# Patient Record
Sex: Male | Born: 2000 | Race: Black or African American | Hispanic: No | Marital: Single
Health system: Southern US, Community
[De-identification: ages and names within clinical notes are randomized; demographics above are authoritative.]

---
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
## 2020-11-22 IMAGING — US US ART/VEN ABD/PELV/SCROTUM DOPPLER LTD
1 series · 14 of 25 positions shown · non-contrast
Comparison: None.

CLINICAL DATA: 18-year-old male with right testicular pain x2
weeks.

EXAM:
SCROTAL ULTRASOUND
DOPPLER ULTRASOUND OF THE TESTICLES
TECHNIQUE: Complete ultrasound examination of the testicles, epididymis, and
other scrotal structures was performed. Color and spectral Doppler
ultrasound were also utilized to evaluate blood flow to the
testicles.

[Series 1: us art/ven abd/pelv/scrotum doppler ltd · 14 of 91 slices shown]
[im 1/91]
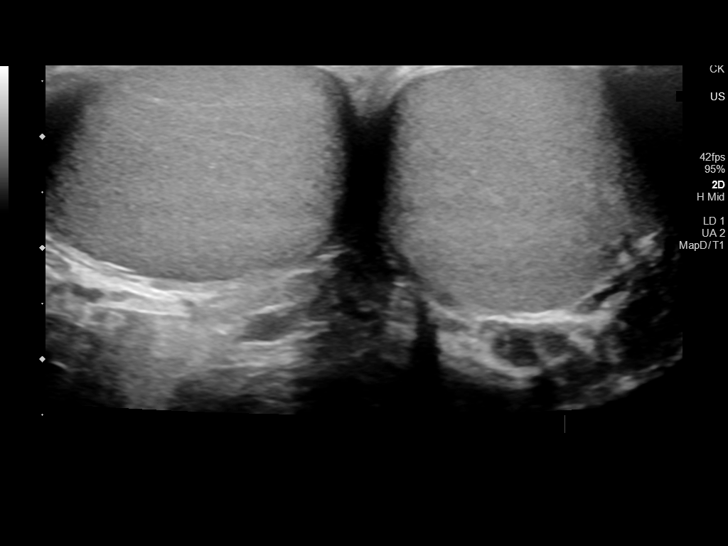
[im 8/91]
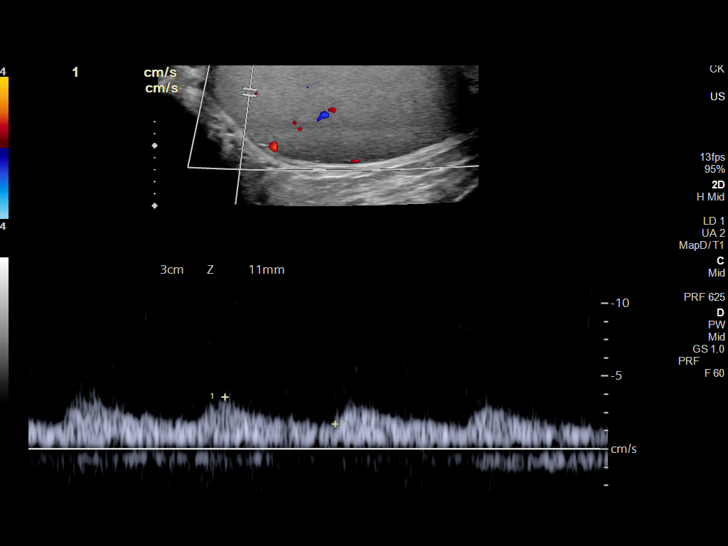
[im 16/91]
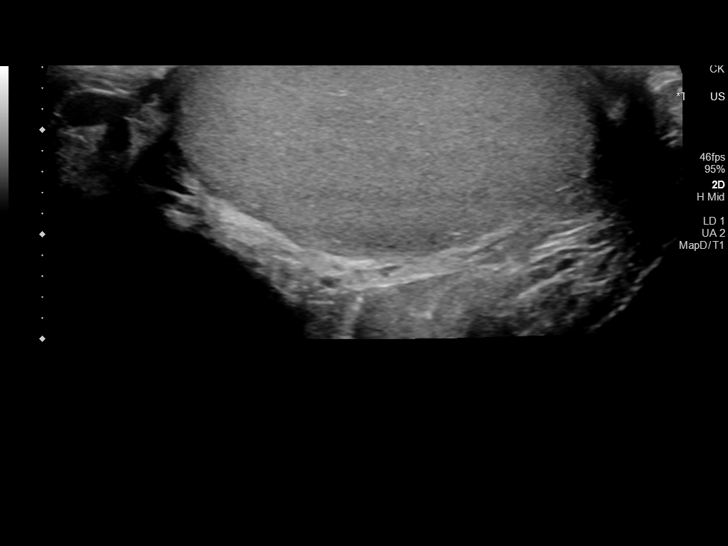
[im 23/91]
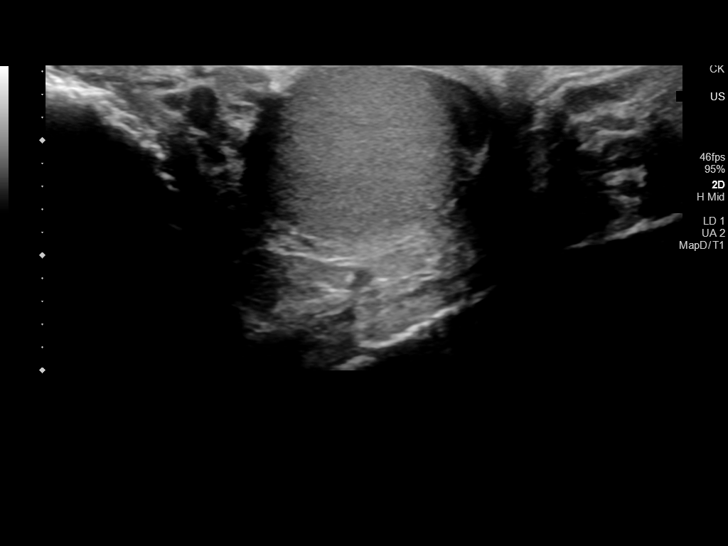
[im 31/91]
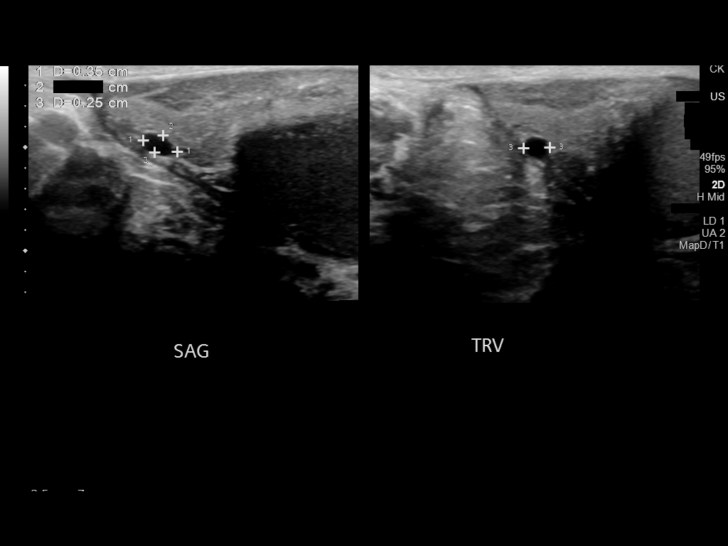
[im 34/91]
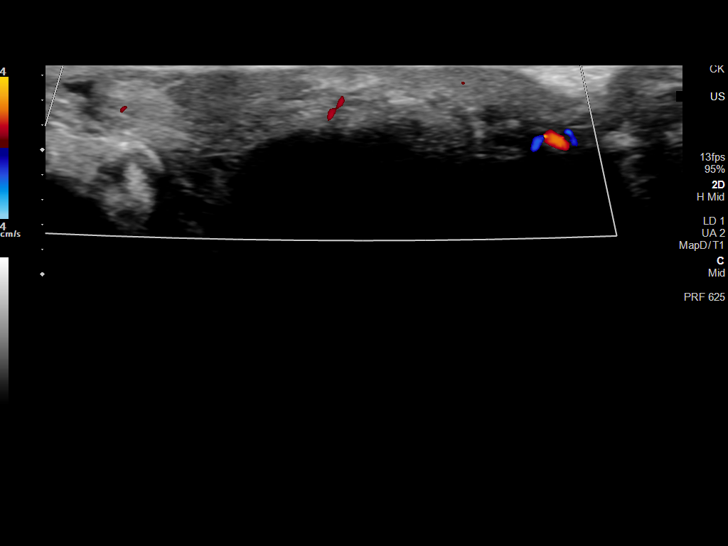
[im 42/91]
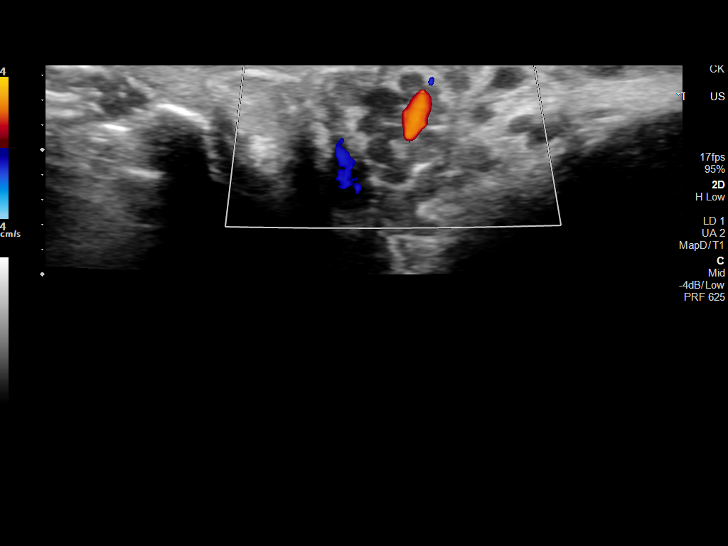
[im 49/91]
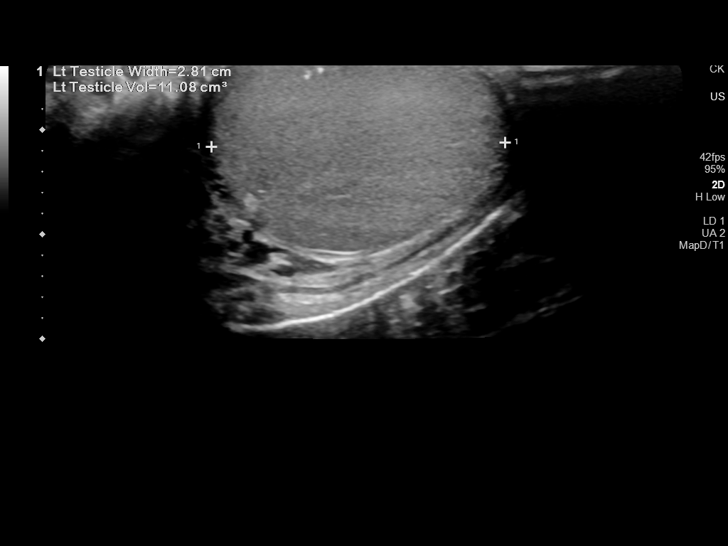
[im 57/91]
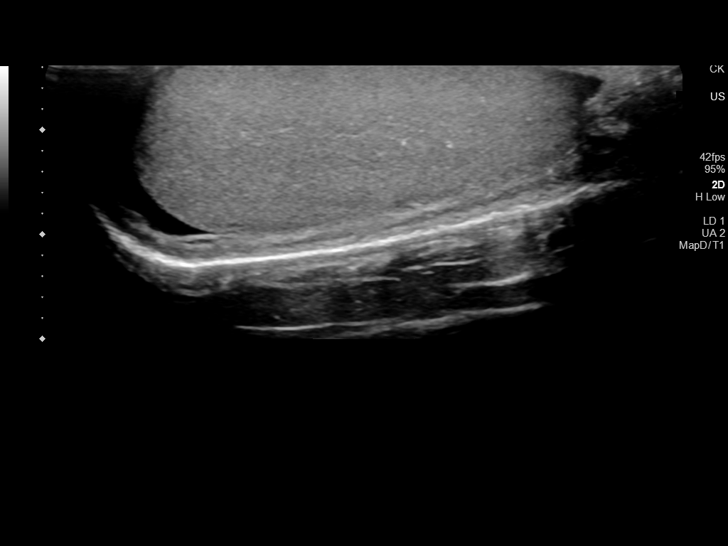
[im 61/91]
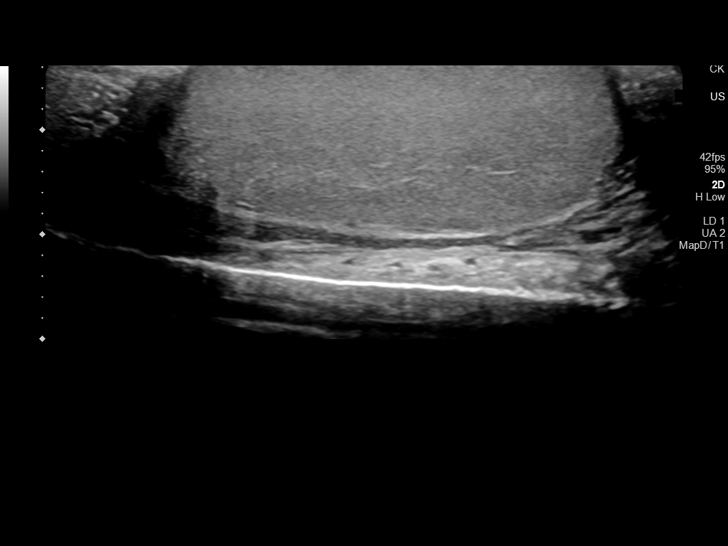
[im 68/91]
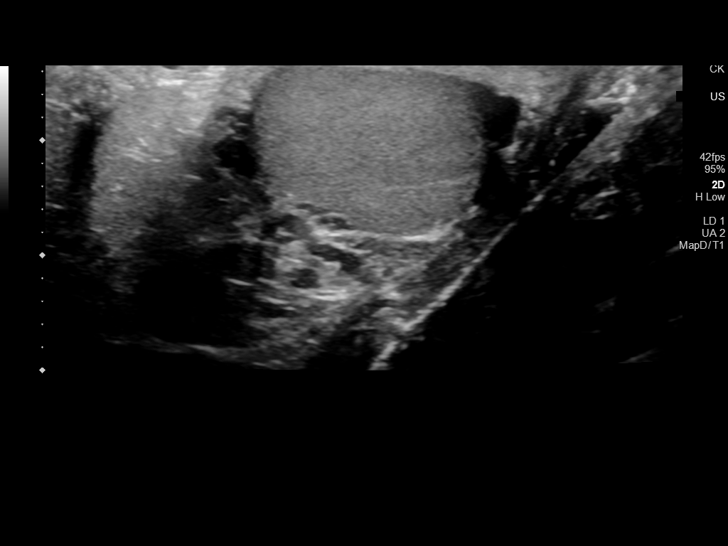
[im 76/91]
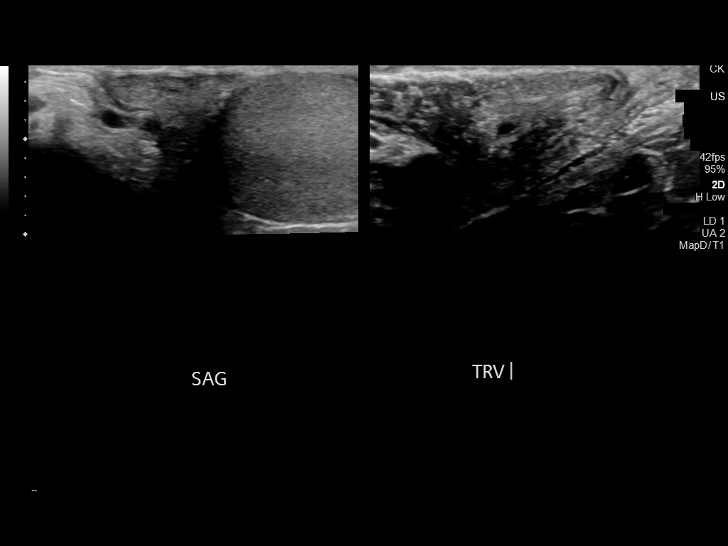
[im 83/91]
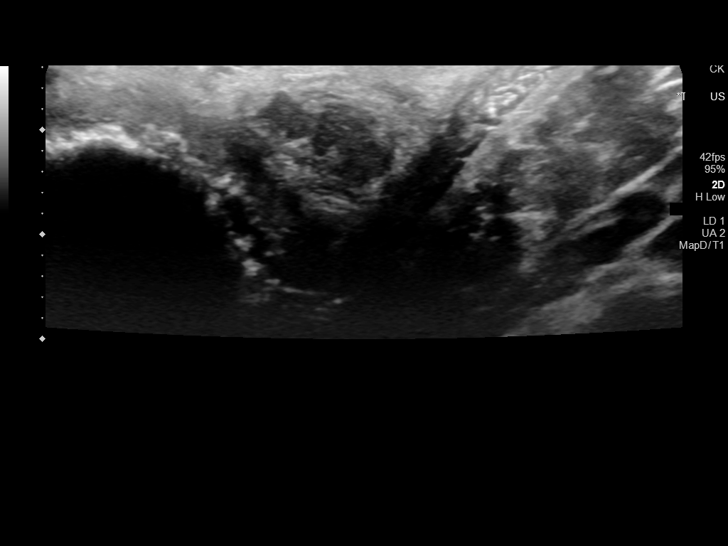
[im 91/91]
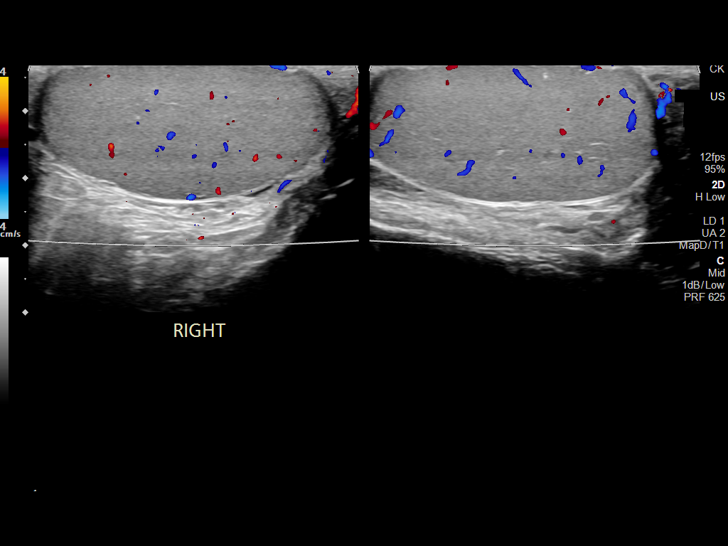

[14 of 25 positions shown; findings below may reference images not displayed]

FINDINGS: Right testicle

Measurements: 4.3 x 2.0 x 2.5 cm. No mass or microlithiasis
visualized.

Left testicle

Measurements: 4.6 x 1.6 x 2.8 cm. No mass or microlithiasis
visualized.

Right epididymis: Normal in size and appearance. A 3 mm right
epididymal head cyst.

Left epididymis: Normal in size and appearance. A 2 mm left
epididymal head cyst.

Hydrocele:  Physiologic fluid versus trace bilateral hydroceles.

Varicocele:  None visualized.

Pulsed Doppler interrogation of both testes demonstrates normal low
resistance arterial and venous waveforms bilaterally.
IMPRESSION: Unremarkable testicular ultrasound.

## 2020-11-22 IMAGING — US US SCROTUM
1 series · 14 of 25 positions shown · non-contrast
Comparison: None.

CLINICAL DATA: 18-year-old male with right testicular pain x2
weeks.

EXAM:
SCROTAL ULTRASOUND
DOPPLER ULTRASOUND OF THE TESTICLES
TECHNIQUE: Complete ultrasound examination of the testicles, epididymis, and
other scrotal structures was performed. Color and spectral Doppler
ultrasound were also utilized to evaluate blood flow to the
testicles.

[Series 1: us scrotum · 14 of 91 slices shown]
[im 1/91]
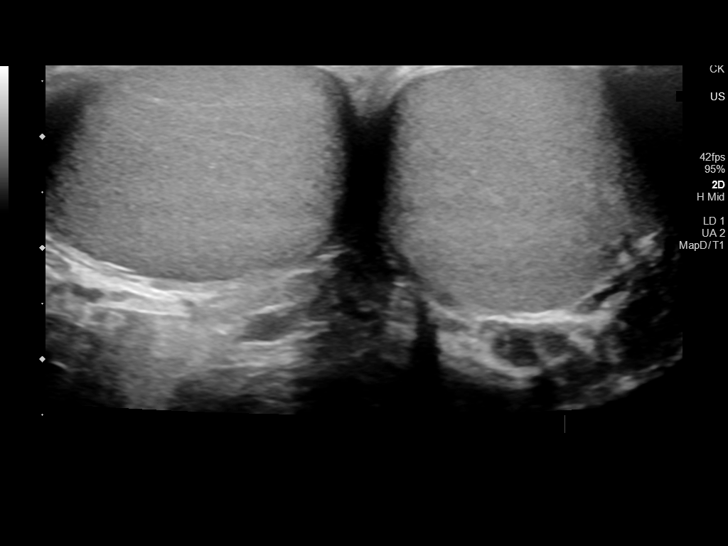
[im 8/91]
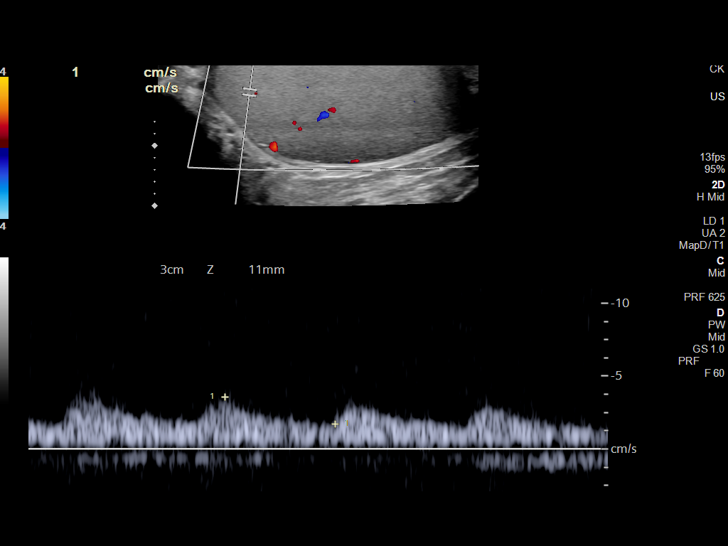
[im 16/91]
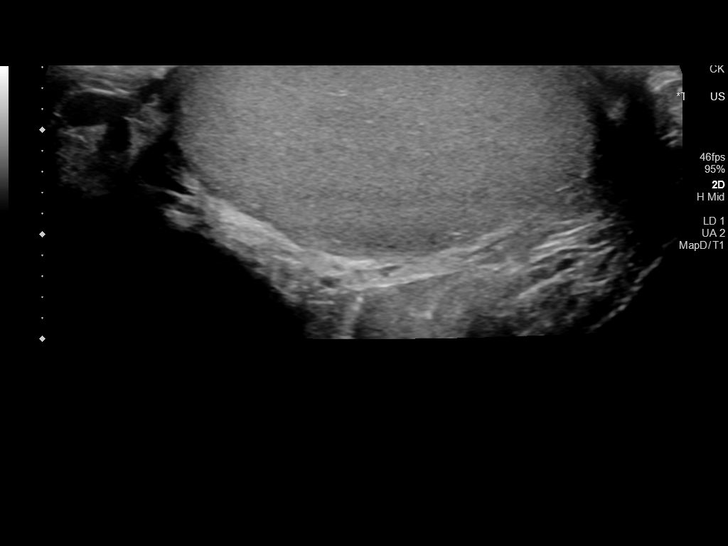
[im 23/91]
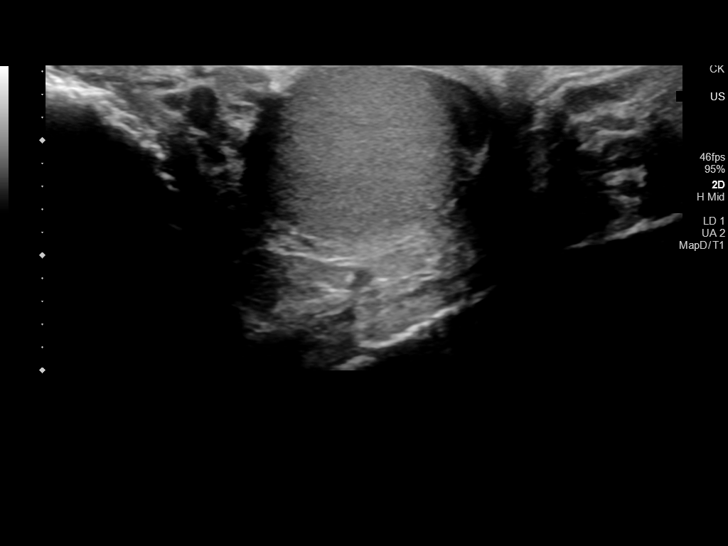
[im 31/91]
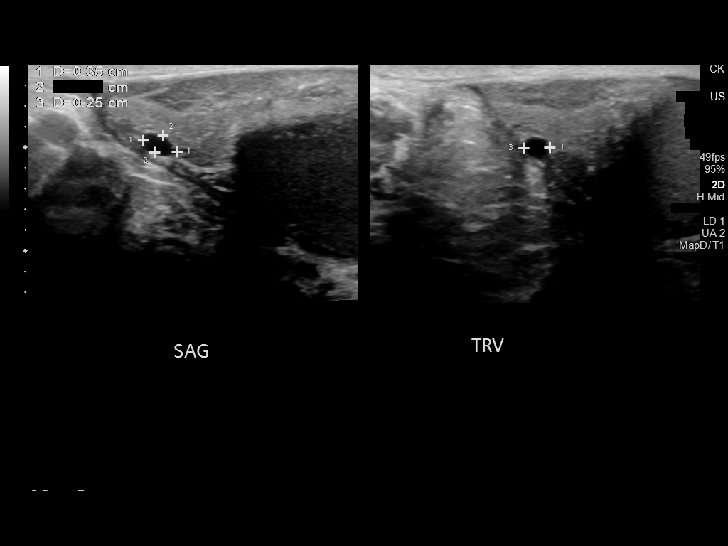
[im 34/91]
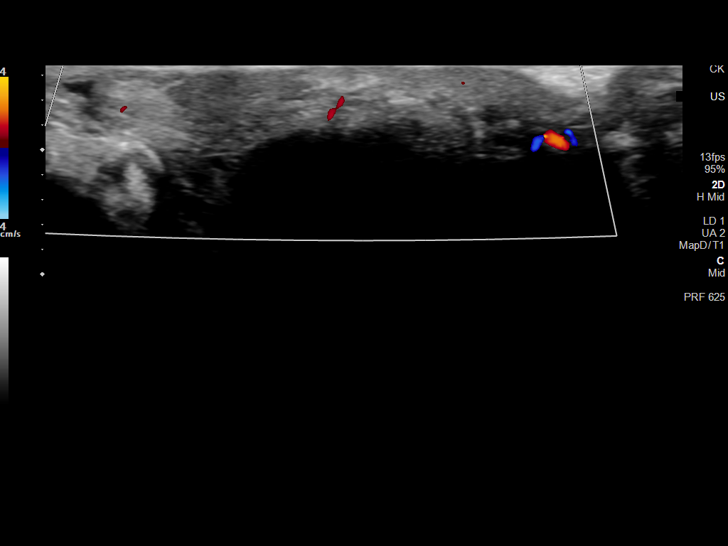
[im 42/91]
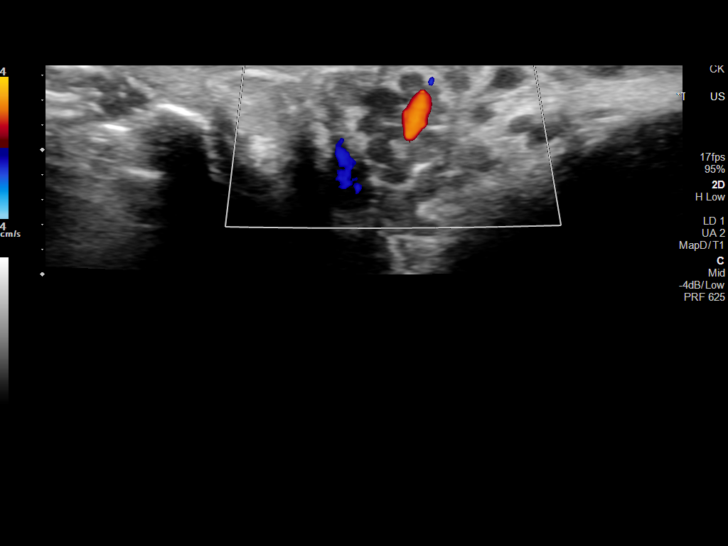
[im 49/91]
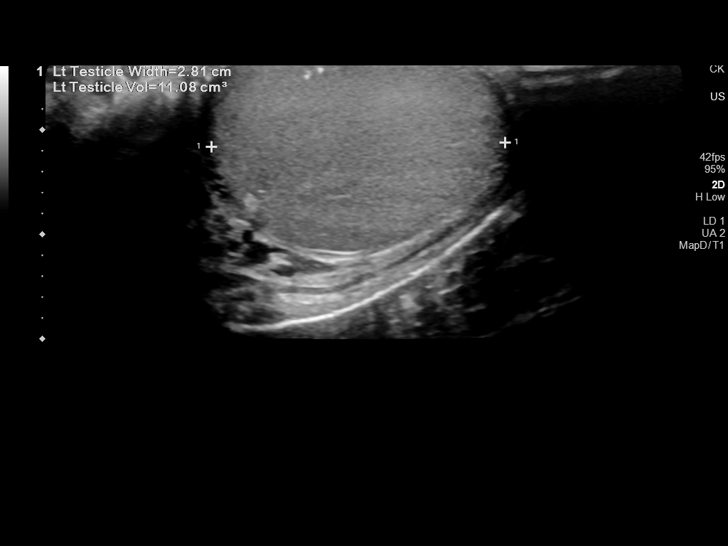
[im 57/91]
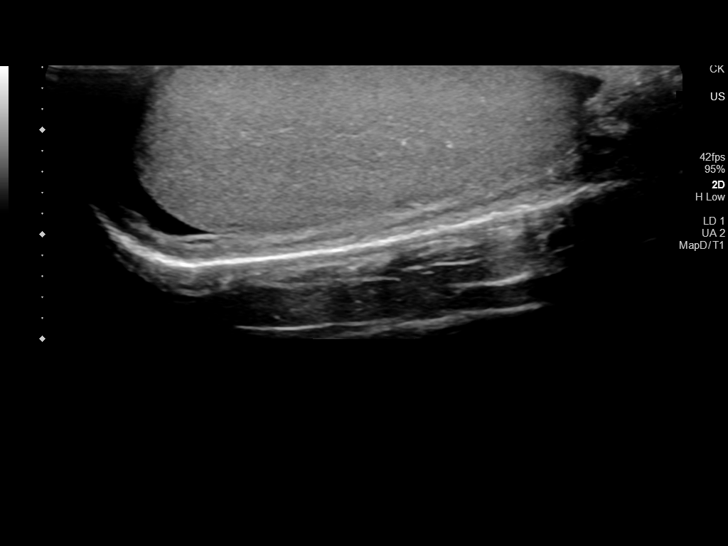
[im 61/91]
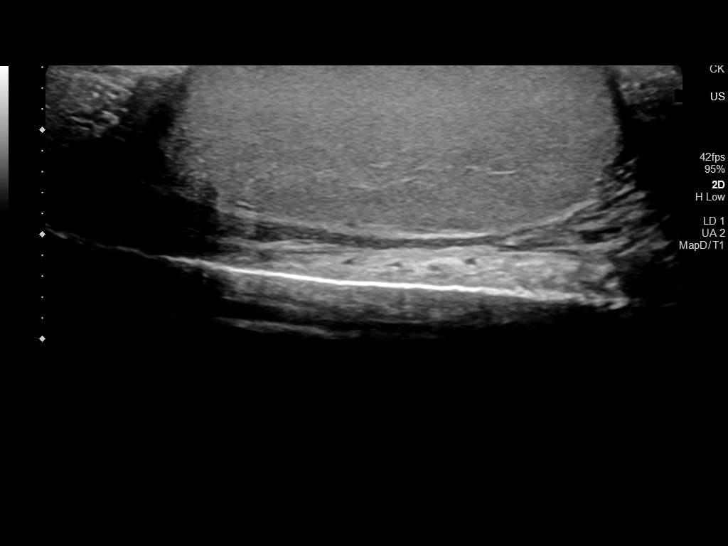
[im 68/91]
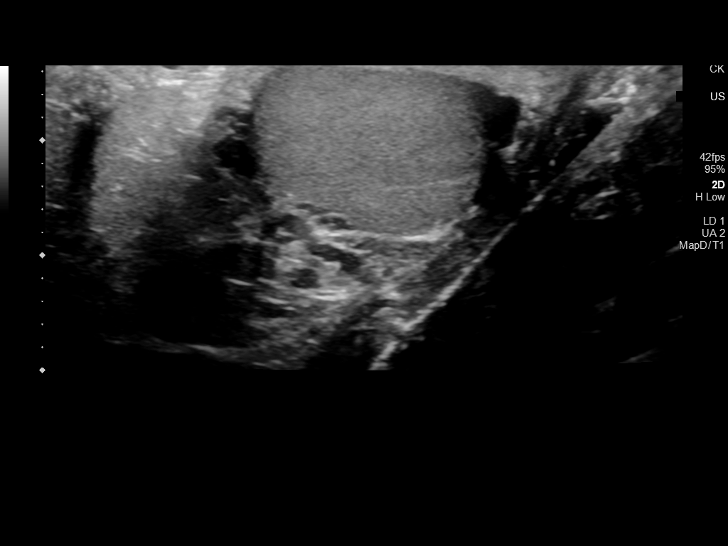
[im 76/91]
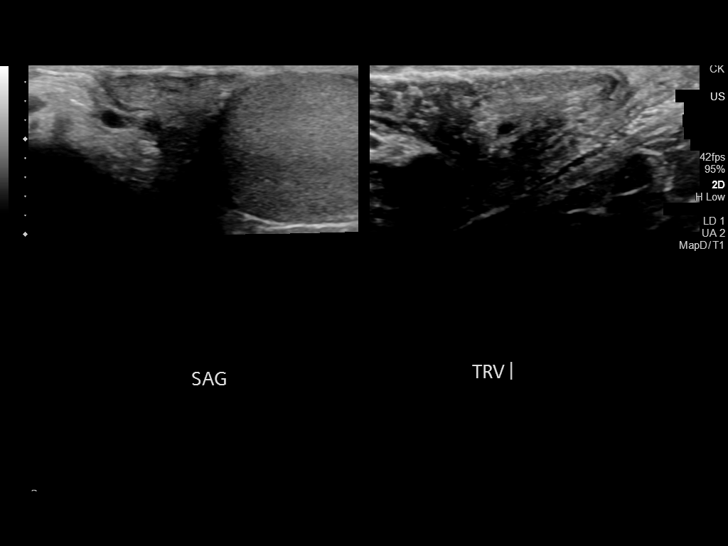
[im 83/91]
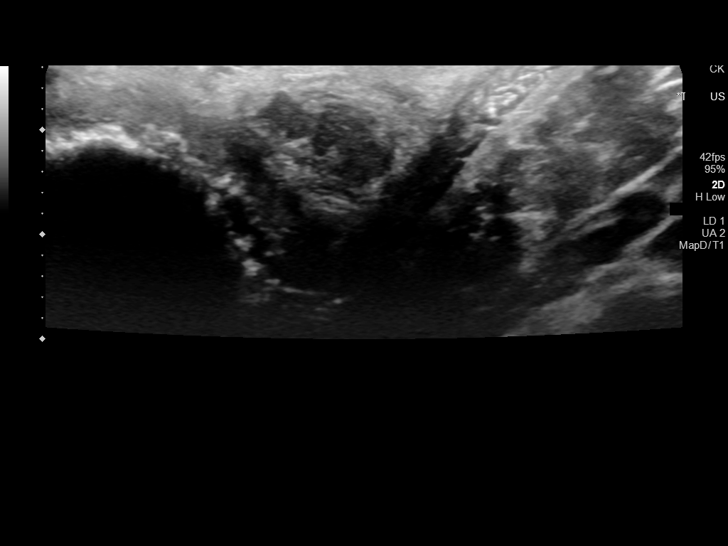
[im 91/91]
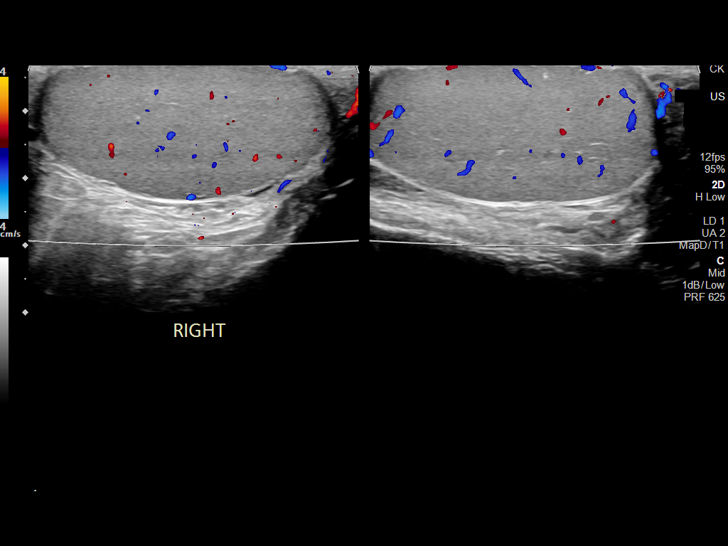

[14 of 25 positions shown; findings below may reference images not displayed]

FINDINGS: Right testicle

Measurements: 4.3 x 2.0 x 2.5 cm. No mass or microlithiasis
visualized.

Left testicle

Measurements: 4.6 x 1.6 x 2.8 cm. No mass or microlithiasis
visualized.

Right epididymis: Normal in size and appearance. A 3 mm right
epididymal head cyst.

Left epididymis: Normal in size and appearance. A 2 mm left
epididymal head cyst.

Hydrocele:  Physiologic fluid versus trace bilateral hydroceles.

Varicocele:  None visualized.

Pulsed Doppler interrogation of both testes demonstrates normal low
resistance arterial and venous waveforms bilaterally.
IMPRESSION: Unremarkable testicular ultrasound.

## 2022-12-05 ENCOUNTER — Other Ambulatory Visit: Payer: Self-pay

## 2022-12-05 ENCOUNTER — Emergency Department (HOSPITAL_COMMUNITY)
Admission: EM | Admit: 2022-12-05 | Discharge: 2022-12-05 | Disposition: A | Discharge status: Home / Self Care | Payer: Medicaid Other | Attending: Emergency Medicine | Admitting: Emergency Medicine

## 2022-12-05 DIAGNOSIS — R21 Rash and other nonspecific skin eruption: Secondary | ICD-10-CM

## 2022-12-05 MED ORDER — TRIAMCINOLONE ACETONIDE 0.1 % EX CREA: 1.0000 | TOPICAL_CREAM | Freq: Two times a day (BID) | CUTANEOUS | 0 refills | Status: AC

## 2022-12-05 NOTE — ED Triage Notes (Signed)
C/o eczema flare up on right lower arm. Cream w/o relief.  Denies any new meds, lotions, or soaps.

## 2022-12-05 NOTE — ED Provider Triage Note (Signed)
Emergency Medicine Provider Triage Evaluation Note  Jerry Leblanc , a 22 y.o. male  was evaluated in triage.  Pt complains of eczema flareup on his right elbow.  Patient extramedical and cream but symptoms have not resolved over the past few days.  Patient states he is from out of town here for school and does not have a dermatologist here but does see 1 at home.  Patient denies any medications, lotions, soaps pain  Review of Systems  Positive: See HPI Negative: See HPI  Physical Exam  BP (!) 140/95 (BP Location: Right Arm)   Pulse 73   Temp 98.4 F (36.9 C) (Oral)   Resp 17   SpO2 100%  Gen:   Awake, no distress   Resp:  Normal effort  MSK:   Moves extremities without difficulty  Other:  Maculopapular rash on right elbow on extensor side with no erythema and no pain to palpation  Medical Decision Making  Medically screening exam initiated at 3:52 PM.  Appropriate orders placed.  Evelyn Moch was informed that the remainder of the evaluation will be completed by another provider, this initial triage assessment does not replace that evaluation, and the importance of remaining in the ED until their evaluation is complete.  Workup initiated, highly suspect eczema   Netta Corrigan, PA-C 12/05/22 1553

## 2022-12-05 NOTE — Discharge Instructions (Signed)
You are seen today in the emergency department due to rash.  He has triamcinolone cream over the rash twice daily for the next week until resolution of the rash.  If you have fevers, it spreads elsewhere to your body, new or concerning symptoms return to the ED for further evaluation otherwise follow-up with your dermatologist.

## 2022-12-05 NOTE — ED Provider Notes (Signed)
[place] EMERGENCY DEPARTMENT AT [facility] Provider Note   CSN: [id number] Arrival date & time: 12/05/22  1540     History  Chief Complaint  Patient presents with   Rash    Jerry Leblanc is a 22 y.o. male.   Rash    Patient presents to the emergency department due to rash.  It is on the extensor side of his right upper extremity/forearm.  Is not painful, slightly pruritic.  History of eczema, uses Aquaphor on it but no improvement.  Denies any recent change in medications, rashes, no antibiotic use.  No involvement of the mucosa, no penile discharge or genital symptoms.  He has had no fevers at home, no medicine tried other than Aquaphor prior to arrival.  Home Medications Prior to Admission medications   Not on File      Allergies    Patient has no known allergies.    Review of Systems   Review of Systems  Skin:  Positive for rash.    Physical Exam Updated Vital Signs BP (!) 140/95 (BP Location: Right Arm)   Pulse 73   Temp 98.4 F (36.9 C) (Oral)   Resp 17   Wt 104 kg   SpO2 100%   BMI 31.98 kg/m  Physical Exam Vitals and nursing note reviewed. Exam conducted with a chaperone present.  Constitutional:      Appearance: Normal appearance.  HENT:     Head: Normocephalic and atraumatic.  Eyes:     General: No scleral icterus.       Right eye: No discharge.        Left eye: No discharge.     Extraocular Movements: Extraocular movements intact.     Pupils: Pupils are equal, round, and reactive to light.  Cardiovascular:     Rate and Rhythm: Normal rate and regular rhythm.     Pulses: Normal pulses.     Heart sounds: Normal heart sounds. No murmur heard.    No friction rub. No gallop.  Pulmonary:     Effort: Pulmonary effort is normal. No respiratory distress.     Breath sounds: Normal breath sounds.  Abdominal:     General: Abdomen is flat. Bowel sounds are normal. There is no distension.     Palpations: Abdomen is soft.     Tenderness:  There is no abdominal tenderness.  Skin:    General: Skin is warm and dry.     Coloration: Skin is not jaundiced.     Comments: No mucosal involvement.  Dry scaly patch right extensor surface forearm.  Neurological:     Mental Status: He is alert. Mental status is at baseline.     Coordination: Coordination normal.     ED Results / Procedures / Treatments   Labs (all labs ordered are listed, but only abnormal results are displayed) Labs Reviewed - No data to display  EKG None  Radiology No results found.  Procedures Procedures    Medications Ordered in ED Medications - No data to display  ED Course/ Medical Decision Making/ A&P                             Medical Decision Making  Patient presents due to rash.  He is afebrile, no respecters TN or SJS.  Symptoms are syndrome.  Will cover triamcinolone cream and have him follow-up with dermatologist for suspected eczema flare.        Final  Clinical Impression(s) / ED Diagnoses Final diagnoses:  None    Rx / DC Orders ED Discharge Orders     None         Theron Arista, PA-C 12/05/22 1639    Rolan Bucco, MD 12/05/22 1945
